# Patient Record
Sex: Male | Born: 1995
Health system: Southern US, Community
[De-identification: ages and names within clinical notes are randomized; demographics above are authoritative.]

## PROBLEM LIST (undated history)

## (undated) DIAGNOSIS — M93001 Unspecified slipped upper femoral epiphysis (nontraumatic), right hip: Secondary | ICD-10-CM

## (undated) DIAGNOSIS — M93002 Unspecified slipped upper femoral epiphysis (nontraumatic), left hip: Secondary | ICD-10-CM

## (undated) HISTORY — DX: Unspecified slipped upper femoral epiphysis (nontraumatic), left hip: M93.002

## (undated) HISTORY — DX: Unspecified slipped upper femoral epiphysis (nontraumatic), right hip: M93.001

---
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
## 2021-12-18 ENCOUNTER — Ambulatory Visit: Payer: 59 | Admitting: Physician Assistant

## 2021-12-29 ENCOUNTER — Encounter: Payer: Self-pay | Admitting: Family Medicine

## 2021-12-29 ENCOUNTER — Ambulatory Visit: Payer: 59 | Admitting: Family Medicine

## 2021-12-29 VITALS — BP 122/78 | HR 60 | Temp 98.5°F | Ht 74.5 in | Wt 200.0 lb

## 2021-12-29 DIAGNOSIS — T148XXA Other injury of unspecified body region, initial encounter: Secondary | ICD-10-CM | POA: Insufficient documentation

## 2021-12-29 DIAGNOSIS — R0981 Nasal congestion: Secondary | ICD-10-CM | POA: Diagnosis not present

## 2021-12-29 DIAGNOSIS — F411 Generalized anxiety disorder: Secondary | ICD-10-CM

## 2021-12-29 DIAGNOSIS — F1010 Alcohol abuse, uncomplicated: Secondary | ICD-10-CM | POA: Diagnosis not present

## 2021-12-29 DIAGNOSIS — F1210 Cannabis abuse, uncomplicated: Secondary | ICD-10-CM

## 2021-12-29 HISTORY — DX: Nasal congestion: R09.81

## 2021-12-29 HISTORY — DX: Other injury of unspecified body region, initial encounter: T14.8XXA

## 2021-12-29 NOTE — Assessment & Plan Note (Signed)
Polysubstance use including THC and alcohol Recommend reduction and cessation

## 2021-12-29 NOTE — Assessment & Plan Note (Signed)
Controlled with OTC Flonase

## 2021-12-29 NOTE — Assessment & Plan Note (Signed)
Area of concern.  Normal Given association with kickboxing injury, may have been hematoma Provided reassurance We will continue to monitor

## 2021-12-29 NOTE — Assessment & Plan Note (Signed)
Counseled on the effects of excessive alcohol use Recommend reduction to no more than 14 drinks per week Continue monitor

## 2021-12-29 NOTE — Assessment & Plan Note (Signed)
Associate with comorbid substance use Recommend reduction as discussed Follow-up in 1 month

## 2021-12-29 NOTE — Progress Notes (Signed)
   Philip Park is a 26 y.o. male who presents today for an office visit.  Assessment/Plan:   Problem List Items Addressed This Visit       Respiratory   Sinus congestion - Primary    Controlled with OTC Flonase        Other   Hematoma    Area of concern.  Normal Given association with kickboxing injury, may have been hematoma Provided reassurance We will continue to monitor      Alcohol abuse    Counseled on the effects of excessive alcohol use Recommend reduction to no more than 14 drinks per week Continue monitor      Generalized anxiety disorder    Associate with comorbid substance use Recommend reduction as discussed Follow-up in 1 month      Tetrahydrocannabinol (THC) use disorder, mild, abuse    Polysubstance use including THC and alcohol Recommend reduction and cessation          Subjective:  HPI:  Philip Park is a 26 y.o. male who has Sinus congestion; Hematoma; Alcohol abuse; Generalized anxiety disorder; and Tetrahydrocannabinol (THC) use disorder, mild, abuse on their problem list..   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   Patient here to establish care.  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  He had surgery on both knees in 2014.  Reports occasional pain for which he takes Tylenol.  Patient reports a bump that appeared on the right upper quadrant of his abdomen.  It was not painful.  It has resolved over the past 2 weeks on its own.  Patient does not have any chest pain or shortness of breath.  Patient is an avid kickboxing.  He thinks it may have occurred while practicing.  Patient reports nasal congestion.  This is intermittent.  It is controlled with OTC Flonase.  Patient  does use marijuana regularly.  And drink approximately 20 shots of liquor a week.  Patient does have elevated GAD-7 today, negative PHQ-9, no SI or HI.      Objective:  Physical Exam: BP 122/78 (BP Location: Left Arm, Patient Position: Sitting, Cuff Size: Large)   Pulse 60   Temp 98.5 F (36.9 C) (Temporal)   Ht 6' 2.5" (1.892 m)   Wt 200 lb (90.7 kg)   SpO2 98%   BMI 25.33 kg/m    General: No acute distress. Awake and conversant.  Eyes: Normal conjunctiva, anicteric. Round symmetric pupils.  ENT: Hearing grossly intact. No nasal discharge.  Neck: Neck is supple. No masses or thyromegaly.  Respiratory: Respirations are non-labored. No auditory wheezing.  ABD: Nontender, nondistended, there is no noticeable lump or nodule or overlying bruising in the area or anywhere else, this was reexamined while patient was bearing down and still no evidence of nodule Skin: Warm. No rashes or ulcers.  Psych: Alert and oriented. Cooperative, Appropriate mood and affect, Normal judgment.  CV: No cyanosis or JVD MSK: Normal ambulation. No clubbing  Neuro: Sensation and CN II-XII grossly normal.        Garner Nash, MD, MS

## 2022-01-26 ENCOUNTER — Ambulatory Visit: Payer: 59 | Admitting: Family Medicine

## 2022-01-26 ENCOUNTER — Encounter: Payer: Self-pay | Admitting: Family Medicine

## 2022-01-26 ENCOUNTER — Other Ambulatory Visit: Payer: Self-pay | Admitting: Family Medicine

## 2022-01-26 VITALS — BP 118/76 | HR 82 | Temp 97.5°F | Wt 209.4 lb

## 2022-01-26 DIAGNOSIS — Z1159 Encounter for screening for other viral diseases: Secondary | ICD-10-CM | POA: Diagnosis not present

## 2022-01-26 DIAGNOSIS — J310 Chronic rhinitis: Secondary | ICD-10-CM

## 2022-01-26 DIAGNOSIS — T485X5A Adverse effect of other anti-common-cold drugs, initial encounter: Secondary | ICD-10-CM

## 2022-01-26 DIAGNOSIS — Z0000 Encounter for general adult medical examination without abnormal findings: Secondary | ICD-10-CM

## 2022-01-26 DIAGNOSIS — T148XXA Other injury of unspecified body region, initial encounter: Secondary | ICD-10-CM

## 2022-01-26 DIAGNOSIS — F1210 Cannabis abuse, uncomplicated: Secondary | ICD-10-CM

## 2022-01-26 DIAGNOSIS — L818 Other specified disorders of pigmentation: Secondary | ICD-10-CM

## 2022-01-26 DIAGNOSIS — R0981 Nasal congestion: Secondary | ICD-10-CM

## 2022-01-26 DIAGNOSIS — F1010 Alcohol abuse, uncomplicated: Secondary | ICD-10-CM

## 2022-01-26 DIAGNOSIS — Z23 Encounter for immunization: Secondary | ICD-10-CM

## 2022-01-26 DIAGNOSIS — Z136 Encounter for screening for cardiovascular disorders: Secondary | ICD-10-CM | POA: Diagnosis not present

## 2022-01-26 DIAGNOSIS — Z1329 Encounter for screening for other suspected endocrine disorder: Secondary | ICD-10-CM

## 2022-01-26 DIAGNOSIS — F411 Generalized anxiety disorder: Secondary | ICD-10-CM

## 2022-01-26 DIAGNOSIS — Z114 Encounter for screening for human immunodeficiency virus [HIV]: Secondary | ICD-10-CM

## 2022-01-26 LAB — LIPID PANEL
Cholesterol: 132 mg/dL (ref 0–200)
HDL: 44.1 mg/dL (ref >39.00)
LDL Cholesterol: 78 mg/dL (ref 0–99)
NonHDL: 87.61
Total CHOL/HDL Ratio: 3
Triglycerides: 46 mg/dL (ref 0.0–149.0)
VLDL: 9.2 mg/dL (ref 0.0–40.0)

## 2022-01-26 LAB — COMPREHENSIVE METABOLIC PANEL
ALT: 25 U/L (ref 0–53)
AST: 21 U/L (ref 0–37)
Albumin: 4.4 g/dL (ref 3.5–5.2)
Alkaline Phosphatase: 50 U/L (ref 39–117)
BUN: 13 mg/dL (ref 6–23)
CO2: 27 mEq/L (ref 19–32)
Calcium: 9.4 mg/dL (ref 8.4–10.5)
Chloride: 102 mEq/L (ref 96–112)
Creatinine, Ser: 0.88 mg/dL (ref 0.40–1.50)
GFR: 118.34 mL/min (ref >60.00)
Glucose, Bld: 89 mg/dL (ref 70–99)
Potassium: 4.1 mEq/L (ref 3.5–5.1)
Sodium: 137 mEq/L (ref 135–145)
Total Bilirubin: 0.6 mg/dL (ref 0.2–1.2)
Total Protein: 7.4 g/dL (ref 6.0–8.3)

## 2022-01-26 MED ORDER — FLUTICASONE PROPIONATE 50 MCG/ACT NA SUSP: 1.0000 | Freq: Every day | NASAL | 11 refills | Status: AC

## 2022-01-26 MED ORDER — FLUTICASONE PROPIONATE 50 MCG/ACT NA SUSP: 1.0000 | Freq: Every day | NASAL | 11 refills | Status: DC

## 2022-01-26 NOTE — Assessment & Plan Note (Signed)
Recommend cessation of Afrin Restart Flonase, if no improvement follow-up

## 2022-01-26 NOTE — Assessment & Plan Note (Signed)
Resolved

## 2022-01-26 NOTE — Assessment & Plan Note (Signed)
Patient says he is working on reduction Encourage reduction in the healthy levels

## 2022-01-26 NOTE — Assessment & Plan Note (Signed)
Improved GAD-7 Reports no complaints of anxiety

## 2022-01-26 NOTE — Assessment & Plan Note (Signed)
Recommend continue reduction and cessation
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
## 2022-01-27 ENCOUNTER — Other Ambulatory Visit: Payer: Self-pay

## 2022-01-27 DIAGNOSIS — Z23 Encounter for immunization: Secondary | ICD-10-CM

## 2022-01-27 LAB — HEPATITIS B SURFACE ANTIGEN: Hepatitis B Surface Ag: NONREACTIVE

## 2022-01-27 LAB — HEPATITIS B CORE ANTIBODY, TOTAL: Hep B Core Total Ab: NONREACTIVE

## 2022-01-27 LAB — HIV ANTIBODY (ROUTINE TESTING W REFLEX): HIV 1&2 Ab, 4th Generation: NONREACTIVE

## 2022-01-27 LAB — HEPATITIS B SURFACE ANTIBODY,QUALITATIVE: Hep B S Ab: NONREACTIVE

## 2022-01-27 LAB — HEPATITIS C ANTIBODY: Hepatitis C Ab: NONREACTIVE

## 2022-02-10 ENCOUNTER — Ambulatory Visit (INDEPENDENT_AMBULATORY_CARE_PROVIDER_SITE_OTHER): Payer: 59

## 2022-02-10 DIAGNOSIS — Z23 Encounter for immunization: Secondary | ICD-10-CM

## 2022-02-10 NOTE — Progress Notes (Signed)
Per orders of Dr. Janee Morn Pt is here for Hep B vaccine. Pt received injection IM in the RT deltoid, given by Dominic Pea, CMA. Pt tolerated injection well. Pt was notified to report any adverse reactions to me immediately. Sw, cma

## 2022-04-14 ENCOUNTER — Ambulatory Visit: Payer: 59 | Admitting: Family Medicine

## 2022-04-14 ENCOUNTER — Encounter: Payer: Self-pay | Admitting: Family Medicine

## 2022-04-14 VITALS — BP 134/82 | HR 85 | Temp 97.6°F | Wt 201.2 lb

## 2022-04-14 DIAGNOSIS — H6123 Impacted cerumen, bilateral: Secondary | ICD-10-CM | POA: Diagnosis not present

## 2022-04-14 DIAGNOSIS — H9191 Unspecified hearing loss, right ear: Secondary | ICD-10-CM

## 2022-04-14 MED ORDER — ACETIC ACID 2 % OT SOLN: 4.0000 [drp] | Freq: Three times a day (TID) | OTIC | 0 refills | Status: AC

## 2022-04-14 NOTE — Progress Notes (Signed)
Assessment/Plan:   Problem List Items Addressed This Visit   None Visit Diagnoses     Bilateral impacted cerumen    -  Primary   Relevant Medications   acetic acid 2 % otic solution   Decreased hearing of right ear              Subjective:  HPI:  Philip Park is a 26 y.o. male who has Sinus congestion; Hematoma; Alcohol abuse; Generalized anxiety disorder; Tetrahydrocannabinol (THC) use disorder, mild, abuse; and Rhinitis medicamentosa on their problem list..   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 ) .   Ear Pain: Patient presents with right ear pain.  Symptoms include plugged sensation in the right ear and decreased hearing . Symptoms began a few days ago and are gradually improving since that time. Patient denies chills, dyspnea, eye irritation, fever, nasal congestion, nonproductive cough, productive cough, sneezing, sore throat, and sweats. Ear history: 0 previous ear infections.  Past Surgical History:  Procedure Laterality Date   fcfe  2013    Outpatient Medications Prior to Visit  Medication Sig Dispense Refill   fluticasone (FLONASE) 50 MCG/ACT nasal spray Place 1 spray into both nostrils daily. 1 g 11   No facility-administered medications prior to visit.    Family History  Problem Relation Age of Onset   Hypertension Mother    Hypertension Maternal Grandmother    Diabetes Maternal Grandmother     Social History   Socioeconomic History   Marital status: Single    Spouse name: Not on file   Number of children: Not on file   Years of education: Not on file   Highest education level: Not on file  Occupational History   Occupation: Merchant navy officer  Tobacco Use   Smoking status: Former    Packs/day: 0.50    Years: 1.00    Total pack years: 0.50    Types: Cigarettes   Smokeless  tobacco: Never  Vaping Use   Vaping Use: Every day  Substance and Sexual Activity   Alcohol use: Yes    Alcohol/week: 20.0 standard drinks of alcohol    Types: 20 Shots of liquor per week   Drug use: Yes    Types: Marijuana   Sexual activity: Yes  Other Topics Concern   Not on file  Social History Narrative   Not on file   Social Determinants of Health   Financial Resource Strain: Not on file  Food Insecurity: Not on file  Transportation Needs: Not on file  Physical Activity: Not on file  Stress: Not on file  Social Connections: Not on file  Intimate Partner Violence: Not on file                                                                                                 Objective:  Physical Exam: BP 134/82 (BP Location: Left Arm, Patient Position: Sitting, Cuff Size: Large)   Pulse 85  Temp 97.6 F (36.4 C) (Temporal)   Wt 201 lb 3.2 oz (91.3 kg)   SpO2 98%   BMI 25.49 kg/m    General: No acute distress. Awake and conversant.  Eyes: Normal conjunctiva, anicteric. Round symmetric pupils.  ENT: Hearing grossly intact. No nasal discharge.  Bilateral complete cerumen impaction,  Neck: Neck is supple. No masses or thyromegaly.  Respiratory: Respirations are non-labored. No auditory wheezing.  Skin: Warm. No rashes or ulcers.  Psych: Alert and oriented. Cooperative, Appropriate mood and affect, Normal judgment.  CV: No cyanosis or JVD MSK: Normal ambulation. No clubbing  Neuro: Sensation and CN II-XII grossly normal.    Cerumen was removed using gentle irrigation. Tympanic membranes are intact following the procedure.  Auditory canals are normal. Hearing improved     Garner Nash, MD, MS

## 2022-07-09 ENCOUNTER — Encounter: Payer: Self-pay | Admitting: Family Medicine

## 2022-07-09 ENCOUNTER — Ambulatory Visit (INDEPENDENT_AMBULATORY_CARE_PROVIDER_SITE_OTHER): Payer: 59 | Admitting: Family Medicine

## 2022-07-09 VITALS — BP 124/78 | HR 75 | Temp 97.7°F | Wt 202.6 lb

## 2022-07-09 DIAGNOSIS — R350 Frequency of micturition: Secondary | ICD-10-CM | POA: Diagnosis not present

## 2022-07-09 DIAGNOSIS — R319 Hematuria, unspecified: Secondary | ICD-10-CM | POA: Diagnosis not present

## 2022-07-09 DIAGNOSIS — Z113 Encounter for screening for infections with a predominantly sexual mode of transmission: Secondary | ICD-10-CM | POA: Diagnosis not present

## 2022-07-09 LAB — POCT URINALYSIS DIPSTICK
Bilirubin, UA: NEGATIVE
Blood, UA: POSITIVE
Glucose, UA: NEGATIVE
Ketones, UA: NEGATIVE
Leukocytes, UA: NEGATIVE
Nitrite, UA: NEGATIVE
Protein, UA: NEGATIVE
Spec Grav, UA: 1.01 (ref 1.010–1.025)
Urobilinogen, UA: 0.2 E.U./dL — AB
pH, UA: 5.5 (ref 5.0–8.0)

## 2022-07-09 NOTE — Patient Instructions (Signed)
For urinary symptoms, we are testing for STD's and infection as discuss. Continue hydrating, follow up if no improvement.

## 2022-07-09 NOTE — Progress Notes (Signed)
Assessment/Plan:   Problem List Items Addressed This Visit   None Visit Diagnoses     Frequent urination    -  Primary   Relevant Orders   POCT Urinalysis Dipstick (Completed)   C. trachomatis/N. gonorrhoeae RNA (Completed)   HIV Antibody (routine testing w rflx) (Completed)   RPR (Completed)   HCV Ab w Reflex to Quant PCR (Completed)   Hematuria, unspecified type       Relevant Orders   C. trachomatis/N. gonorrhoeae RNA (Completed)   HIV Antibody (routine testing w rflx) (Completed)   RPR (Completed)   HCV Ab w Reflex to Quant PCR (Completed)   Screen for STD (sexually transmitted disease)       Relevant Orders   HIV Antibody (routine testing w rflx) (Completed)   RPR (Completed)   HCV Ab w Reflex to Quant PCR (Completed)      The patient's chief complaint is of a recent onset of urinary discomfort accompanied by hematuria noted on urinalysis. There's no evidence of a typical urinary tract infection, but given the blood in the urine, an STD or other urological condition cannot be ruled out.  Differential diagnosis:  Urinary tract infection (less common in males, especially young ones without typical symptoms) Sexually transmitted disease (gonorrhea or chlamydia, considering the demographic even in the absence of discharge) Urolithiasis (kidney stones) Traumatic injury (from kickboxing or other activities)  Plan: Based on the patient's symptoms and the urinalysis findings, we will proceed with the STD testing, including gonorrhea and chlamydia by urine, as well as a urine culture for bacteria. If the patient is agreeable, blood tests for syphilis, HIV, and hepatitis C should be included in the screening to rule out these conditions. Further investigation and referral to a urologist may be warranted if symptoms persist or worsen or if the patient reports new or concerning symptoms in the future.  There are no discontinued medications.    Subjective:  CHIEF COMPLAINT: The  patient presents with urinary discomfort that began yesterday.  HISTORY OF PRESENT ILLNESS:  Problem 1: The patient reports experiencing urinary discomfort that started yesterday. He denies a burning sensation with urination but describes difficulty urinating and frequency. The symptoms seem to be improving today without any specific interventions. The patient has no history of similar symptoms. Urinalysis revealed some blood but was negative for typical signs of infection like nitrites and white blood cells. Given the patient's gender and age, the possibility of an STD was discussed as a potential cause, despite the absence of penile discharge.  Problem 2: The patient has a new tattoo with a recent application of a second skin dressing. He reports the need for the tattoo to be retouched but does not report any complications related to the tattoo.  REVIEW OF SYSTEMS: The patient denies fevers, chills, nausea, vomiting, and any penile discharge. All other systems were reviewed and reported negative.  Past Surgical History:  Procedure Laterality Date   fcfe  2013    Outpatient Medications Prior to Visit  Medication Sig Dispense Refill   fluticasone (FLONASE) 50 MCG/ACT nasal spray Place 1 spray into both nostrils daily. 1 g 11   No facility-administered medications prior to visit.    Family History  Problem Relation Age of Onset   Hypertension Mother    Hypertension Maternal Grandmother    Diabetes Maternal Grandmother     Social History   Socioeconomic History   Marital status: Single    Spouse name: Not on file  Number of children: Not on file   Years of education: Not on file   Highest education level: Not on file  Occupational History   Occupation: Technician  Tobacco Use   Smoking status: Former    Packs/day: 0.50    Years: 1.00    Total pack years: 0.50    Types: Cigarettes   Smokeless tobacco: Never  Vaping Use   Vaping Use: Every day  Substance and Sexual  Activity   Alcohol use: Yes    Alcohol/week: 20.0 standard drinks of alcohol    Types: 20 Shots of liquor per week   Drug use: Yes    Types: Marijuana   Sexual activity: Yes  Other Topics Concern   Not on file  Social History Narrative   Not on file   Social Determinants of Health   Financial Resource Strain: Not on file  Food Insecurity: Not on file  Transportation Needs: Not on file  Physical Activity: Not on file  Stress: Not on file  Social Connections: Not on file  Intimate Partner Violence: Not on file                                                                                                 Objective:  Physical Exam: BP 124/78 (BP Location: Left Arm, Patient Position: Sitting, Cuff Size: Large)   Pulse 75   Temp 97.7 F (36.5 C) (Temporal)   Wt 202 lb 9.6 oz (91.9 kg)   SpO2 98%   BMI 25.66 kg/m    PHYSICAL EXAMINATION: General: No acute distress. Awake and conversant. Eyes: Normal conjunctiva, anicteric. Round symmetric pupils. ENT: Hearing grossly intact. No nasal discharge. Neck: Neck is supple. No masses or thyromegaly. Respiratory: Respirations are non-labored. No auditory wheezing. Skin: Warm. No rashes or ulcers. Tattoo on arm present and appears well-healed without signs of recent infection. Psych: Alert and oriented. Cooperative, with an appropriate mood and affect. Normal judgment. CV: No cyanosis or JVD MSK: Normal ambulation. No clubbing Neuro: CN II-XII grossly normal, no tremor  LABS: Initial urinalysis showed some blood. No nitrites or white blood cells were present. Further STD testing by urine and blood, as well as a urine culture, were discussed and are pending.      Alesia Banda, MD, MS

## 2022-07-10 LAB — C. TRACHOMATIS/N. GONORRHOEAE RNA
C. trachomatis RNA, TMA: NOT DETECTED
N. gonorrhoeae RNA, TMA: NOT DETECTED

## 2022-07-10 LAB — HCV AB W REFLEX TO QUANT PCR: HCV Ab: NONREACTIVE

## 2022-07-10 LAB — HCV INTERPRETATION

## 2022-07-10 LAB — SYPHILIS: RPR W/REFLEX TO RPR TITER AND TREPONEMAL ANTIBODIES, TRADITIONAL SCREENING AND DIAGNOSIS ALGORITHM: RPR Ser Ql: NONREACTIVE

## 2022-07-10 LAB — HIV ANTIBODY (ROUTINE TESTING W REFLEX): HIV 1&2 Ab, 4th Generation: NONREACTIVE

## 2023-01-29 ENCOUNTER — Encounter: Payer: 59 | Admitting: Family Medicine

## 2023-02-04 ENCOUNTER — Encounter: Payer: Self-pay | Admitting: Family Medicine

## 2023-02-04 ENCOUNTER — Ambulatory Visit (INDEPENDENT_AMBULATORY_CARE_PROVIDER_SITE_OTHER): Payer: 59 | Admitting: Family Medicine

## 2023-02-04 VITALS — BP 118/60 | HR 75 | Temp 98.7°F | Ht 74.5 in | Wt 205.4 lb

## 2023-02-04 DIAGNOSIS — Z130 Encounter for screening for diseases of the blood and blood-forming organs and certain disorders involving the immune mechanism: Secondary | ICD-10-CM | POA: Diagnosis not present

## 2023-02-04 DIAGNOSIS — Z136 Encounter for screening for cardiovascular disorders: Secondary | ICD-10-CM | POA: Diagnosis not present

## 2023-02-04 DIAGNOSIS — Z0000 Encounter for general adult medical examination without abnormal findings: Secondary | ICD-10-CM | POA: Diagnosis not present

## 2023-02-04 DIAGNOSIS — Z1321 Encounter for screening for nutritional disorder: Secondary | ICD-10-CM | POA: Diagnosis not present

## 2023-02-04 DIAGNOSIS — G4700 Insomnia, unspecified: Secondary | ICD-10-CM | POA: Diagnosis not present

## 2023-02-04 DIAGNOSIS — M549 Dorsalgia, unspecified: Secondary | ICD-10-CM

## 2023-02-04 DIAGNOSIS — Z1329 Encounter for screening for other suspected endocrine disorder: Secondary | ICD-10-CM

## 2023-02-04 DIAGNOSIS — G8929 Other chronic pain: Secondary | ICD-10-CM | POA: Diagnosis not present

## 2023-02-04 DIAGNOSIS — Z13228 Encounter for screening for other metabolic disorders: Secondary | ICD-10-CM

## 2023-02-04 LAB — BASIC METABOLIC PANEL
BUN: 14 mg/dL (ref 6–23)
CO2: 29 mEq/L (ref 19–32)
Calcium: 9.2 mg/dL (ref 8.4–10.5)
Chloride: 104 mEq/L (ref 96–112)
Creatinine, Ser: 0.91 mg/dL (ref 0.40–1.50)
GFR: 115.16 mL/min (ref >60.00)
Glucose, Bld: 90 mg/dL (ref 70–99)
Potassium: 4.1 mEq/L (ref 3.5–5.1)
Sodium: 139 mEq/L (ref 135–145)

## 2023-02-04 LAB — LIPID PANEL
Cholesterol: 164 mg/dL (ref 0–200)
HDL: 45.8 mg/dL (ref >39.00)
LDL Cholesterol: 107 mg/dL — ABNORMAL HIGH (ref 0–99)
NonHDL: 118.68
Total CHOL/HDL Ratio: 4
Triglycerides: 59 mg/dL (ref 0.0–149.0)
VLDL: 11.8 mg/dL (ref 0.0–40.0)

## 2023-02-04 MED ORDER — TRAZODONE HCL 50 MG PO TABS: 25.0000 mg | ORAL_TABLET | Freq: Every evening | ORAL | 3 refills | Status: DC | PRN

## 2023-02-04 NOTE — Assessment & Plan Note (Signed)
Continue seeing the chiropractor. Suggest trying over-the-counter topical Voltaren gel for symptomatic relief.

## 2023-02-04 NOTE — Progress Notes (Signed)
Assessment  Assessment/Plan:   Problem List Items Addressed This Visit       Other   Insomnia    Trial of Trazodone 25-50 mg PO at bedtime as needed. Patient Counseling: Discussed sleep hygiene practices, avoiding stimuli like phone screens before bed, regular sleep schedules, and avoiding heavy exercise late in the evening. Follow-up: If symptoms persist or worsen, consider referral for cognitive-behavioral therapy for insomnia (CBT-I).      Relevant Medications   traZODone (DESYREL) 50 MG tablet   Chronic back pain    Continue seeing the chiropractor. Suggest trying over-the-counter topical Voltaren gel for symptomatic relief.      Relevant Medications   traZODone (DESYREL) 50 MG tablet   Other Visit Diagnoses     Encounter for well adult exam without abnormal findings    -  Primary   Screening, heart disease, ischemic       Relevant Orders   Lipid Profile   Screening for endocrine, nutritional, metabolic and immunity disorder       Relevant Orders   Basic Metabolic Panel (BMET)       There are no discontinued medications.  Patient Counseling(The following topics were reviewed and/or handout was given):  -Nutrition: Stressed importance of moderation in sodium/caffeine intake, saturated fat and cholesterol, caloric balance, sufficient intake of fresh fruits, vegetables, and fiber.  -Stressed the importance of regular exercise.   -Substance Abuse: Discussed cessation/primary prevention of tobacco, alcohol, or other drug use; driving or other dangerous activities under the influence; availability of treatment for abuse.   -Injury prevention: Discussed safety belts, safety helmets, smoke detector, smoking near bedding or upholstery.   -Sexuality: Discussed sexually transmitted diseases, partner selection, use of condoms, avoidance of unintended pregnancy and contraceptive alternatives.   -Dental health: Discussed importance of regular tooth brushing, flossing, and dental  visits.  -Health maintenance and immunizations reviewed. Please refer to Health maintenance section.  Return to care in 1 year for next preventative visit.       Subjective:  Chief complaint Encounter date: 02/04/2023  Chief Complaint  Patient presents with   Annual Exam    With fasting lab work, concerns with insomnia   Philip Park is a 27 y.o. male who presents today for his annual comprehensive physical exam.    History of Present Illness: The patient, Philip Park, a healthy 27 year old male, presented for his annual physical exam. 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 Last year's labs indicated excellent metabolic parameters, and he reports continuing similar health habits. However, he mentioned having trouble falling asleep and staying asleep over the past few months. He denies caffeine use but consumes alcohol once a week (3-4 drinks). He recently decreased his intake of alcohol, marijuana, and other substances compared to last year. He also started seeing a chiropractor for back and neck pain, which he attributes to previous hip surgery and heavy training.  Lifestyle:  Diet:Emphasis on eating more fruits and vegetables. Exercise: Kickboxing regularly, as discussed.  ROS     02/04/2023    8:28 AM 01/26/2022    1:37 PM 12/29/2021    1:42 PM  GAD-7 Generalized Anxiety Disorder Screening Tool  1. Feeling Nervous, Anxious, or on Edge 1 2 2   2. Not Being Able to Stop or Control Worrying 1 0 1  3. Worrying Too Much About Different Things 1 1 1   4. Trouble Relaxing 1 2 2   5. Being So Restless it's Hard To Sit Still 0  0 2  6. Becoming Easily Annoyed or Irritable 1 2 3   7. Feeling Afraid As If Something Awful Might Happen 0 0 0  Total GAD-7 Score 5 7 11   Difficulty At Work, Home, or Getting  Along With Others? Not difficult at all Somewhat difficult Somewhat difficult      02/04/2023    8:28 AM 01/26/2022    1:37 PM 12/29/2021    1:42 PM  Depression  screen PHQ 2/9  Decreased Interest 1 1 1   Down, Depressed, Hopeless 1 0 0  PHQ - 2 Score 2 1 1   Altered sleeping 2 1 1   Tired, decreased energy 1 1 1   Change in appetite 1 0 1  Feeling bad or failure about yourself  0 0 0  Trouble concentrating 1 1 0  Moving slowly or fidgety/restless 0 0 1  Suicidal thoughts 0 0 0  PHQ-9 Score 7 4 5   Difficult doing work/chores Somewhat difficult Not difficult at all Somewhat difficult    Health Maintenance Due  Topic Date Due   HPV VACCINES (2 - Male 3-dose series) 02/23/2022   INFLUENZA VACCINE  01/28/2023     Dental: Up-to-date Vision: Up-to-date  PMH:  The following were reviewed and entered/updated in epic: Past Medical History:  Diagnosis Date   SCFE (slipped capital femoral epiphysis), left    SCFE (slipped capital femoral epiphysis), right     Patient Active Problem List   Diagnosis Date Noted   Insomnia 02/04/2023   Chronic back pain 02/04/2023   Rhinitis medicamentosa 01/26/2022   Sinus congestion 12/29/2021   Hematoma 12/29/2021   Alcohol abuse 12/29/2021   Generalized anxiety disorder 12/29/2021   Tetrahydrocannabinol (THC) use disorder, mild, abuse 12/29/2021    Past Surgical History:  Procedure Laterality Date   fcfe  2013    Family History  Problem Relation Age of Onset   Hypertension Mother    Hypertension Maternal Grandmother    Diabetes Maternal Grandmother     Medications- reviewed and updated Outpatient Medications Prior to Visit  Medication Sig Dispense Refill   fluticasone (FLONASE) 50 MCG/ACT nasal spray Place 1 spray into both nostrils daily. 1 g 11   No facility-administered medications prior to visit.    Allergies  Allergen Reactions   Penicillin G Rash   Penicillins Rash    Social History   Socioeconomic History   Marital status: Single    Spouse name: Not on file   Number of children: Not on file   Years of education: Not on file   Highest education level: Not on file   Occupational History   Occupation: Technician  Tobacco Use   Smoking status: Former    Current packs/day: 0.50    Average packs/day: 0.5 packs/day for 1 year (0.5 ttl pk-yrs)    Types: Cigarettes   Smokeless tobacco: Never  Vaping Use   Vaping status: Every Day  Substance and Sexual Activity   Alcohol use: Yes    Alcohol/week: 20.0 standard drinks of alcohol    Types: 20 Shots of liquor per week   Drug use: Yes    Types: Marijuana   Sexual activity: Yes  Other Topics Concern   Not on file  Social History Narrative   Not on file   Social Determinants of Health   Financial Resource Strain: Not on file  Food Insecurity: Not on file  Transportation Needs: Not on file  Physical Activity: Not on file  Stress: Not on file  Social Connections: Not  on file        Objective:  Physical Exam: BP 118/60 (BP Location: Left Arm)   Pulse 75   Temp 98.7 F (37.1 C) (Oral)   Ht 6' 2.5" (1.892 m)   Wt 205 lb 6.4 oz (93.2 kg)   SpO2 99%   BMI 26.02 kg/m   Body mass index is 26.02 kg/m. Wt Readings from Last 3 Encounters:  02/04/23 205 lb 6.4 oz (93.2 kg)  07/09/22 202 lb 9.6 oz (91.9 kg)  04/14/22 201 lb 3.2 oz (91.3 kg)    Physical Exam      At today's visit, we discussed treatment options, associated risk and benefits, and engage in counseling as needed.  Additionally the following were reviewed: Past medical records, past medical and surgical history, family and social background, as well as relevant laboratory results, imaging findings, and specialty notes, where applicable.  This message was generated using dictation software, and as a result, it may contain unintentional typos or errors.  Nevertheless, extensive effort was made to accurately convey at the pertinent aspects of the patient visit.    There may have been are other unrelated non-urgent complaints, but due to the busy schedule and the amount of time already spent with him, time does not permit to address  these issues at today's visit. Another appointment may have or has been requested to review these additional issues.   Thomes Dinning, MD, MS

## 2023-02-04 NOTE — Patient Instructions (Signed)
Follow sleep hygiene guidelines: Avoid using devices before bedtime, maintain a consistent sleep schedule, and avoid exercising right before bed. Use Trazodone as needed: Take 25-50 mg at night if having trouble falling or staying asleep. Continue current health practices: Maintain regular exercise, a healthy diet, and avoid excessive alcohol and smoking. Use over-the-counter Voltaren gel: Apply to areas with joint pain or aches as needed. Wear seat belts and sunscreen: Use seat belts while driving and apply sunscreen regularly to prevent skin damage.

## 2023-02-04 NOTE — Assessment & Plan Note (Signed)
Trial of Trazodone 25-50 mg PO at bedtime as needed. Patient Counseling: Discussed sleep hygiene practices, avoiding stimuli like phone screens before bed, regular sleep schedules, and avoiding heavy exercise late in the evening. Follow-up: If symptoms persist or worsen, consider referral for cognitive-behavioral therapy for insomnia (CBT-I).

## 2023-11-17 ENCOUNTER — Ambulatory Visit: Payer: Self-pay

## 2023-11-17 NOTE — Telephone Encounter (Signed)
  Chief Complaint: tick bite Symptoms: redness Frequency: two days ago Pertinent Negatives: Patient denies target appearing rash, fever Disposition: [] ED /[] Urgent Care (no appt availability in office) / [] Appointment(In office/virtual)/ []  [facility]/ [x] Home Care/ [] Refused Recommended Disposition /[] [facility]/ []  Follow-up with PCP Additional Notes: patient with tick bite occurring two days ago. Denies bullseye rash, generalized rash, pain, fever. Reviewed signs and symptoms to call back, verbalized understanding, will use home care methods Copied from CRM [id number]. Topic: Clinical - Red Word Triage >> Nov 17, 2023 11:27 AM Adonis Hoot wrote: Red Word that prompted transfer to Nurse Triage: tick bite on abdomen 2 days ago ,red and itchy Reason for Disposition  Unknown type of tick bite with no complications  Answer Assessment - Initial Assessment Questions 1. ATTACHED:  "Is the tick still on the skin?"  (e.g., yes, no, unsure)     no  3. ONSET - TICK NOT STILL ATTACHED: "If the tick has been removed, how long do you think the tick was attached before you removed it?" (e.g., 5 hours, 2 days). "When was this?"     2 days ago 4. LOCATION: "Where is the tick bite located?" (e.g., arm, leg)     abdomen 5. TYPE of TICK: "Is it a wood tick or a deer tick?" (e.g., deer tick, wood tick; unsure)     unsure 6. SIZE of TICK: "How big is the tick?" (e.g., size of poppy seed, apple seed, watermelon seed; unsure) Note: Deer ticks can be the size of a poppy seed (nymph) or an apple seed (adult).       Smaller than an apple seed 7. ENGORGED: "Did the tick look flat or engorged (full, swollen)?" (e.g., flat, engorged; unsure)     no 8. OTHER SYMPTOMS: "Do you have any other symptoms?" (e.g., fever, rash, redness at bite area, red ring around bite)     Redness at bite area, dime sized area of redness  Protocols used: Tick Bite-A-AH

## 2023-11-17 NOTE — Telephone Encounter (Signed)
 Called pt and schedule a OV for tomorrow at 1:20 pm to address concern. Patient verbalized understanding.

## 2023-11-18 ENCOUNTER — Encounter: Payer: Self-pay | Admitting: Family Medicine

## 2023-11-18 ENCOUNTER — Ambulatory Visit: Admitting: Family Medicine

## 2023-11-18 VITALS — BP 113/60 | HR 72 | Temp 98.0°F | Resp 18 | Wt 209.0 lb

## 2023-11-18 DIAGNOSIS — W57XXXA Bitten or stung by nonvenomous insect and other nonvenomous arthropods, initial encounter: Secondary | ICD-10-CM

## 2023-11-18 DIAGNOSIS — S30861A Insect bite (nonvenomous) of abdominal wall, initial encounter: Secondary | ICD-10-CM

## 2023-11-18 HISTORY — DX: Bitten or stung by nonvenomous insect and other nonvenomous arthropods, initial encounter: W57.XXXA

## 2023-11-18 NOTE — Progress Notes (Signed)
 Assessment & Plan   Assessment/Plan:    Assessment & Plan Tick bite with local reaction Tick bite on the abdomen with mild erythema and pruritus, no purulent drainage, healing well. No systemic symptoms such as fever, chills, headache, or rash. Differential diagnosis includes potential for [facility] spotted fever or Lyme disease, though these are not currently suspected due to lack of systemic symptoms. Discussed potential for Alpha-gal syndrome, which could cause an allergy to red meat, but no symptoms present at this time. Emphasized the importance of monitoring for symptoms such as fever, rash, arthralgia, or malaise, which would necessitate prompt medical attention. Discussed prevention strategies including the use of DEET-containing insect repellent and minimizing skin exposure in tick-prone areas. - Apply topical hydrocortisone or diphenhydramine gel for pruritus. - Take over-the-counter antihistamines such as diphenhydramine or cetirizine for pruritus. - Keep the bite area clean. - Use DEET-containing insect repellent to prevent future tick bites. - Monitor for symptoms such as fever, rash, arthralgia, or malaise, and seek medical attention if these occur. - Monitor for signs of Alpha-gal syndrome, such as oropharyngeal swelling, dyspnea, or emesis after consuming red meat.      There are no discontinued medications.  Return if symptoms worsen or fail to improve.        Subjective:   Encounter date: 11/18/2023  Philip Park is a 28 y.o. male who has Sinus congestion; Hematoma; Alcohol abuse; Generalized anxiety disorder; Tetrahydrocannabinol (THC) use disorder, mild, abuse; Rhinitis medicamentosa; Insomnia; Chronic back pain; and Tick bite of abdominal wall on their problem list..   He  has a past medical history of SCFE (slipped capital femoral epiphysis), left and SCFE (slipped capital femoral epiphysis), right.Philip Park   He presents with chief complaint of Tick  Removal (Pt c/o of tick bite on abdomin for 2 days very itchy no pain present//HM due- HPV vaccine (2nd) ) .   Discussed the use of AI scribe software for clinical note transcription with the patient, who gave verbal consent to proceed.  History of Present Illness Philip Park "Philip Park" is a 28 year old male who presents with a tick bite.  He was bitten by a tick approximately three days ago on his abdomen. The bite is slightly itchy and red but appears to be improving. There is no associated pain.  No fever, chills, headaches, vomiting, or rashes have occurred since the tick bite. He describes the tick as small and brown, without distinctive markings such as a star.  He is concerned about potential allergic reactions to meat following the tick bite, as he primarily consumes red meat.  He is interested in finding out his blood type, as he does not know it.       Past Surgical History:  Procedure Laterality Date   fcfe  2013    Outpatient Medications Prior to Visit  Medication Sig Dispense Refill   traZODone  (DESYREL ) 50 MG tablet Take 0.5-1 tablets (25-50 mg total) by mouth at bedtime as needed for sleep. 30 tablet 3   fluticasone  (FLONASE ) 50 MCG/ACT nasal spray Place 1 spray into both nostrils daily. 1 g 11   No facility-administered medications prior to visit.    Family History  Problem Relation Age of Onset   Hypertension Mother    Hypertension Maternal Grandmother    Diabetes Maternal Grandmother     Social History   Socioeconomic History   Marital status: Single    Spouse name: Not on file   Number of children: Not on  file   Years of education: Not on file   Highest education level: Not on file  Occupational History   Occupation: Technician  Tobacco Use   Smoking status: Former    Current packs/day: 0.50    Average packs/day: 0.5 packs/day for 1 year (0.5 ttl pk-yrs)    Types: Cigarettes   Smokeless tobacco: Never  Vaping Use   Vaping status: Every Day   Substance and Sexual Activity   Alcohol use: Yes    Alcohol/week: 20.0 standard drinks of alcohol    Types: 20 Shots of liquor per week   Drug use: Yes    Types: Marijuana   Sexual activity: Yes  Other Topics Concern   Not on file  Social History Narrative   Not on file   Social Drivers of Health   Financial Resource Strain: Not on file  Food Insecurity: Not on file  Transportation Needs: Not on file  Physical Activity: Not on file  Stress: Not on file  Social Connections: Not on file  Intimate Partner Violence: Not on file                                                                                                  Objective:  Physical Exam: BP 113/60 (BP Location: Left Arm, Patient Position: Sitting, Cuff Size: Large)   Pulse 72   Temp 98 F (36.7 C) (Temporal)   Resp 18   Wt 209 lb (94.8 kg)   SpO2 100%   BMI 26.48 kg/m    Physical Exam GENERAL: Alert, cooperative, well developed, no acute distress. HEENT: Normocephalic, normal oropharynx, moist mucous membranes. CHEST: Clear to auscultation bilaterally, no wheezes, rhonchi, or crackles. CARDIOVASCULAR: Normal heart rate and rhythm, S1 and S2 normal without murmurs. ABDOMEN: Soft, non-tender, non-distended, without organomegaly, normal bowel sounds. Tick bite on left lower abdomen with erythema, nontender, no purulent drainage, healing well. EXTREMITIES: No cyanosis or edema. NEUROLOGICAL: Cranial nerves grossly intact, moves all extremities without gross motor or sensory deficit.     No results found.  No results found for this or any previous visit (from the past 2160 hours).      Carnell Christian, MD, MS

## 2024-02-08 ENCOUNTER — Encounter: Payer: Self-pay | Admitting: Family Medicine

## 2024-02-08 ENCOUNTER — Ambulatory Visit: Admitting: Family Medicine

## 2024-02-08 VITALS — BP 112/55 | HR 60 | Temp 97.9°F | Resp 18 | Wt 209.6 lb

## 2024-02-08 DIAGNOSIS — Z0000 Encounter for general adult medical examination without abnormal findings: Secondary | ICD-10-CM | POA: Diagnosis not present

## 2024-02-08 DIAGNOSIS — E782 Mixed hyperlipidemia: Secondary | ICD-10-CM | POA: Diagnosis not present

## 2024-02-08 DIAGNOSIS — Z23 Encounter for immunization: Secondary | ICD-10-CM | POA: Diagnosis not present

## 2024-02-08 DIAGNOSIS — E860 Dehydration: Secondary | ICD-10-CM | POA: Diagnosis not present

## 2024-02-08 DIAGNOSIS — G4700 Insomnia, unspecified: Secondary | ICD-10-CM | POA: Diagnosis not present

## 2024-02-08 MED ORDER — TRAZODONE HCL 50 MG PO TABS: 25.0000 mg | ORAL_TABLET | Freq: Every evening | ORAL | 3 refills | Status: DC | PRN

## 2024-02-08 NOTE — Patient Instructions (Signed)
  VISIT SUMMARY: Today, you came in for your annual physical exam. We discussed your overall health, including your history of mild hyperlipidemia, occasional dizziness, and intermittent insomnia. We also talked about your recent activities and family life.  YOUR PLAN: -ADULT WELLNESS VISIT: This is your routine annual check-up to assess your overall health. 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 We will conduct several lab tests, including a fasting lipid panel, hemoglobin A1c, fasting metabolic panel, TSH, complete metabolic panel, urinalysis with micro plus reflex culture, and micro creatinine, microbium creatinine ratio. Vaccines were also administered today.  -HYPERLIPIDEMIA, CONTROLLED WITH LIFESTYLE MODIFICATION: Hyperlipidemia means you have higher levels of fats in your blood, which can increase your risk of heart disease. Your condition is currently controlled with lifestyle changes. We will recheck your lipid levels with a fasting lipid panel at a future visit.  -DEHYDRATION, MILD: Dehydration occurs when your body loses more fluids than it takes in. 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 Please increase your water intake, especially during hot and humid weather.  -INSOMNIA, INTERMITTENT: Insomnia is difficulty falling or staying asleep. You have been managing it with trazodone  as needed but experience fogginess the next day. Try taking a quarter dose of trazodone  to reduce these side effects. We can refill your prescription if needed.  INSTRUCTIONS: Please schedule a follow-up appointment for your fasting lab work. Continue to monitor your water intake and try the adjusted dose of trazodone  for your insomnia. If you have any concerns or experience any new symptoms, contact our office.

## 2024-02-08 NOTE — Progress Notes (Signed)
 Assessment  Assessment/Plan:  Assessment and Plan Assessment & Plan Adult Wellness Visit Annual physical examination with borderline low blood pressure, likely due to mild dehydration. Vitals otherwise stable. 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 - Order fasting lipid panel, hemoglobin A1c, fasting metabolic panel, TSH, complete metabolic panel, urinalysis with micro plus reflex culture, and micro creatinine, microbium creatinine ratio. - Administer vaccines.  Hyperlipidemia, controlled with lifestyle modification Mild hyperlipidemia controlled with lifestyle modifications. Lipid panel deferred due to non-fasting status. - Order fasting lipid panel for future visit.  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 Orthostatic vitals borderline, indicating possible dehydration. - Advise increased water intake, especially during hot and humid weather.  Insomnia, intermittent Intermittent insomnia managed with trazodone  as needed. Reports feeling foggy after trazodone  use. - Advise trying a quarter dose of trazodone  to reduce side effects. - Refill trazodone  prescription if needed.     Medications Discontinued During This Encounter  Medication Reason   traZODone  (DESYREL ) 50 MG tablet Reorder    Patient Counseling(The following topics were reviewed and/or handout was given):  -Nutrition: Stressed importance of moderation in sodium/caffeine intake, saturated fat and cholesterol, caloric balance, sufficient intake of fresh fruits, vegetables, and fiber.  -Stressed the importance of regular exercise.   -Substance Abuse: Discussed cessation/primary prevention of tobacco, alcohol, or other drug use; driving or other dangerous activities under the influence; availability of treatment for abuse.   -Injury prevention: Discussed safety belts, safety helmets, smoke detector, smoking near bedding or upholstery.    -Sexuality: Discussed sexually transmitted diseases, partner selection, use of condoms, avoidance of unintended pregnancy and contraceptive alternatives.   -Dental health: Discussed importance of regular tooth brushing, flossing, and dental visits.  -Health maintenance and immunizations reviewed. Please refer to Health maintenance section.  Return in about 1 year (around 02/07/2025) for physical (fasting labs).        Subjective:   Encounter date: 02/08/2024  Chief Complaint  Patient presents with   Annual Exam    Pt is not fasting today.   HM due- vaccinations    Dizziness    Pt voiced dizziness when standing     Discussed the use of AI scribe software for clinical note transcription with the patient, who gave verbal consent to proceed.  History of Present Illness Philip Park is a 28 year old male who presents for an annual physical exam.  He is here for his annual physical exam, during which his fasting lipid panel will be rechecked and he will be screened for potential cardiac risk factors, including diabetes. He has a history of mild hyperlipidemia, which is controlled with lifestyle modifications. He is not fasting today, so the lab work will be scheduled for a future date.  He experiences occasional dizziness. He has not been engaging in kickboxing recently but has been doing a lot of farming, specifically raising chickens, which involves significant physical activity outdoors. He notes that his orthostatic blood pressure readings have been borderline low, with lower numbers around 58 to 62.  He mentions a previous tick bite on his abdomen that occurred in May, which left a mild scar. No ongoing issues related to the tick bite.  He occasionally uses trazodone  as needed for sleep, taking half a pill approximately once a month. He notes that it makes him feel foggy the next day, so he uses it minimally. He also takes melatonin occasionally to aid sleep.  He has two  children, aged  eight and fifteen, and describes them as 'crazy'. His family life is busy, with his children recently starting school.       02/08/2024   10:51 AM 11/18/2023    1:54 PM 02/04/2023    8:28 AM 01/26/2022    1:37 PM 12/29/2021    1:42 PM  Depression screen PHQ 2/9  Decreased Interest 1 0 1 1 1   Down, Depressed, Hopeless 1 0 1 0 0  PHQ - 2 Score 2 0 2 1 1   Altered sleeping 2  2 1 1   Tired, decreased energy 1  1 1 1   Change in appetite 1  1 0 1  Feeling bad or failure about yourself  0  0 0 0  Trouble concentrating 1  1 1  0  Moving slowly or fidgety/restless 1  0 0 1  Suicidal thoughts 0  0 0 0  PHQ-9 Score 8  7 4 5   Difficult doing work/chores Somewhat difficult  Somewhat difficult Not difficult at all Somewhat difficult       02/08/2024   10:52 AM 02/04/2023    8:28 AM 01/26/2022    1:37 PM 12/29/2021    1:42 PM  GAD 7 : Generalized Anxiety Score  Nervous, Anxious, on Edge 1 1 2 2   Control/stop worrying 1 1 0 1  Worry too much - different things 1 1 1 1   Trouble relaxing 1 1 2 2   Restless 1 0 0 2  Easily annoyed or irritable 1 1 2 3   Afraid - awful might happen 0 0 0 0  Total GAD 7 Score 6 5 7 11   Anxiety Difficulty Somewhat difficult Not difficult at all Somewhat difficult Somewhat difficult    Health Maintenance Due  Topic Date Due   HPV VACCINES (2 - Male 3-dose series) 02/23/2022   Hepatitis B Vaccines (2 of 3 - 19+ 3-dose series) 03/10/2022       PMH:  The following were reviewed and entered/updated in epic: Past Medical History:  Diagnosis Date   Hematoma 12/29/2021   SCFE (slipped capital femoral epiphysis), left    SCFE (slipped capital femoral epiphysis), right    Sinus congestion 12/29/2021   Tick bite of abdominal wall 11/18/2023    Patient Active Problem List   Diagnosis Date Noted   Insomnia 02/04/2023   Chronic back pain 02/04/2023   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 1 g 11   traZODone  (DESYREL ) 50 MG tablet Take 0.5-1 tablets (25-50 mg total) by mouth at bedtime as needed for sleep. 30 tablet 3   No facility-administered medications prior to visit.    Allergies  Allergen Reactions   Penicillin G Rash   Penicillins Rash    Social History   Socioeconomic History   Marital status: Single    Spouse name: Not on file   Number of children: Not on file   Years of education: Not on file   Highest education level: Not on  file  Occupational History   Occupation: Pensions consultant  Tobacco Use   Smoking status: Former    Current packs/day: 0.50    Average packs/day: 0.5 packs/day for 1 year (0.5 ttl pk-yrs)    Types: Cigarettes    Passive exposure: Current   Smokeless tobacco: Never  Vaping Use   Vaping status: Every Day  Substance and Sexual Activity   Alcohol use: Yes    Alcohol/week: 20.0 standard drinks of alcohol    Types: 20 Shots of liquor per week   Drug use: Yes    Types: Marijuana   Sexual activity: Yes  Other Topics Concern   Not on file  Social History Narrative   Not on file   Social Drivers of Health   Financial Resource Strain: Not on file  Food Insecurity: Not on file  Transportation Needs: Not on file  Physical Activity: Not on file  Stress: Not on file  Social Connections: Not on file           Objective:  Physical Exam: BP (!) 112/55 (BP Location: Left Arm, Patient Position: Supine, Cuff Size: Large)   Pulse 60   Temp 97.9 F (36.6 C)  (Temporal)   Resp 18   Wt 209 lb 9.6 oz (95.1 kg)   SpO2 100%   BMI 26.55 kg/m   Body mass index is 26.55 kg/m. Wt Readings from Last 3 Encounters:  02/08/24 209 lb 9.6 oz (95.1 kg)  11/18/23 209 lb (94.8 kg)  02/04/23 205 lb 6.4 oz (93.2 kg)    Physical Exam GENERAL: Alert, cooperative, well developed, no acute distress HEENT: Normocephalic, normal oropharynx, moist mucous membranes CHEST: Clear to auscultation bilaterally, no wheezes, rhonchi, or crackles CARDIOVASCULAR: Normal heart rate and rhythm, S1 and S2 normal without murmurs ABDOMEN: Soft, non-tender, non-distended, without organomegaly, normal bowel sounds EXTREMITIES: No cyanosis or edema NEUROLOGICAL: Cranial nerves grossly intact, moves all extremities without gross motor or sensory deficit  Physical Exam      Orthostatic VS for the past 72 hrs (Last 3 readings):  Orthostatic BP Patient Position BP Location Cuff Size Orthostatic Pulse  02/08/24 1048 -- Supine Left Arm Large --  02/08/24 1046 102/64 Standing Left Arm Large 76  02/08/24 1041 110/58 Sitting Left Arm Large --  02/08/24 1039 -- Sitting Left Arm Large --    Prior labs:   No results found for this or any previous visit (from the past 2160 hours).  Lab Results  Component Value Date   CHOL 164 02/04/2023   CHOL 132 01/26/2022   Lab Results  Component Value Date   HDL 45.80 02/04/2023   HDL 44.10 01/26/2022   Lab Results  Component Value Date   LDLCALC 107 (H) 02/04/2023   LDLCALC 78 01/26/2022   Lab Results  Component Value Date   TRIG 59.0 02/04/2023   TRIG 46.0 01/26/2022   Lab Results  Component Value Date   CHOLHDL 4 02/04/2023   CHOLHDL 3 01/26/2022   No results found for: LDLDIRECT  Last metabolic panel Lab Results  Component Value Date   GLUCOSE 90 02/04/2023   NA 139 02/04/2023   K 4.1 02/04/2023   CL 104 02/04/2023   CO2 29 02/04/2023   BUN 14 02/04/2023   CREATININE 0.91 02/04/2023   GFR 115.16 02/04/2023    CALCIUM 9.2 02/04/2023   PROT 7.4 01/26/2022   ALBUMIN 4.4 01/26/2022   BILITOT 0.6 01/26/2022   ALKPHOS 50 01/26/2022   AST 21 01/26/2022   ALT  25 01/26/2022    No results found for: HGBA1C  Last CBC No results found for: WBC, HGB, HCT, MCV, MCH, RDW, PLT  No results found for: TSH  No results found for: PSA1, PSA  Last vitamin D No results found for: MARIEN BOLLS, VD25OH  Lab Results  Component Value Date   COLORU yellow 07/09/2022   CLARITYU clwear 07/09/2022   GLUCOSEUR Negative 07/09/2022   BILIRUBINUR neg 07/09/2022   KETONESU neg 07/09/2022   SPECGRAV 1.010 07/09/2022   RBCUR pos 07/09/2022   PHUR 5.5 07/09/2022   PROTEINUR Negative 07/09/2022   UROBILINOGEN 0.2 (A) 07/09/2022   LEUKOCYTESUR Negative 07/09/2022    No results found for: LABMICR, MICROALBUR   At today's visit, we discussed treatment options, associated risk and benefits, and engage in counseling as needed.  Additionally the following were reviewed: Past medical records, past medical and surgical history, family and social background, as well as relevant laboratory results, imaging findings, and specialty notes, where applicable.  This message was generated using dictation software, and as a result, it may contain unintentional typos or errors.  Nevertheless, extensive effort was made to accurately convey at the pertinent aspects of the patient visit.    There may have been are other unrelated non-urgent complaints, but due to the busy schedule and the amount of time already spent with him, time does not permit to address these issues at today's visit. Another appointment may have or has been requested to review these additional issues.     Arvella Hummer, MD, MS

## 2024-02-10 ENCOUNTER — Other Ambulatory Visit (INDEPENDENT_AMBULATORY_CARE_PROVIDER_SITE_OTHER)

## 2024-02-10 DIAGNOSIS — E782 Mixed hyperlipidemia: Secondary | ICD-10-CM | POA: Diagnosis not present

## 2024-02-10 LAB — COMPREHENSIVE METABOLIC PANEL WITH GFR
ALT: 25 U/L (ref 0–53)
AST: 22 U/L (ref 0–37)
Albumin: 4.2 g/dL (ref 3.5–5.2)
Alkaline Phosphatase: 41 U/L (ref 39–117)
BUN: 17 mg/dL (ref 6–23)
CO2: 32 meq/L (ref 19–32)
Calcium: 9 mg/dL (ref 8.4–10.5)
Chloride: 101 meq/L (ref 96–112)
Creatinine, Ser: 0.89 mg/dL (ref 0.40–1.50)
GFR: 116.26 mL/min (ref >60.00)
Glucose, Bld: 89 mg/dL (ref 70–99)
Potassium: 4 meq/L (ref 3.5–5.1)
Sodium: 137 meq/L (ref 135–145)
Total Bilirubin: 0.5 mg/dL (ref 0.2–1.2)
Total Protein: 7 g/dL (ref 6.0–8.3)

## 2024-02-10 LAB — MICROALBUMIN / CREATININE URINE RATIO
Creatinine,U: 61.7 mg/dL
Microalb Creat Ratio: UNDETERMINED mg/g (ref 0.0–30.0)
Microalb, Ur: <0.7 mg/dL

## 2024-02-10 LAB — CBC WITH DIFFERENTIAL/PLATELET
Basophils Absolute: 0 K/uL (ref 0.0–0.1)
Basophils Relative: 0.8 % (ref 0.0–3.0)
Eosinophils Absolute: 0.2 K/uL (ref 0.0–0.7)
Eosinophils Relative: 5.1 % — ABNORMAL HIGH (ref 0.0–5.0)
HCT: 42.1 % (ref 39.0–52.0)
Hemoglobin: 13.9 g/dL (ref 13.0–17.0)
Lymphocytes Relative: 28.1 % (ref 12.0–46.0)
Lymphs Abs: 1.2 K/uL (ref 0.7–4.0)
MCHC: 33.1 g/dL (ref 30.0–36.0)
MCV: 97.3 fl (ref 78.0–100.0)
Monocytes Absolute: 0.5 K/uL (ref 0.1–1.0)
Monocytes Relative: 11 % (ref 3.0–12.0)
Neutro Abs: 2.4 K/uL (ref 1.4–7.7)
Neutrophils Relative %: 55 % (ref 43.0–77.0)
Platelets: 149 K/uL — ABNORMAL LOW (ref 150.0–400.0)
RBC: 4.32 Mil/uL (ref 4.22–5.81)
RDW: 14 % (ref 11.5–15.5)
WBC: 4.4 K/uL (ref 4.0–10.5)

## 2024-02-10 LAB — LIPID PANEL
Cholesterol: 151 mg/dL (ref 0–200)
HDL: 47.8 mg/dL (ref >39.00)
LDL Cholesterol: 86 mg/dL (ref 0–99)
NonHDL: 102.76
Total CHOL/HDL Ratio: 3
Triglycerides: 83 mg/dL (ref 0.0–149.0)
VLDL: 16.6 mg/dL (ref 0.0–40.0)

## 2024-02-10 LAB — HEMOGLOBIN A1C: Hgb A1c MFr Bld: 6.2 % (ref 4.6–6.5)

## 2024-02-10 LAB — TSH: TSH: 0.91 u[IU]/mL (ref 0.35–5.50)

## 2024-02-11 LAB — URINALYSIS W MICROSCOPIC + REFLEX CULTURE
Bacteria, UA: NONE SEEN /HPF
Bilirubin Urine: NEGATIVE
Glucose, UA: NEGATIVE
Hgb urine dipstick: NEGATIVE
Hyaline Cast: NONE SEEN /LPF
Ketones, ur: NEGATIVE
Leukocyte Esterase: NEGATIVE
Nitrites, Initial: NEGATIVE
Protein, ur: NEGATIVE
RBC / HPF: NONE SEEN /HPF (ref 0–2)
Specific Gravity, Urine: 1.01 (ref 1.001–1.035)
Squamous Epithelial / HPF: NONE SEEN /HPF (ref <=5)
WBC, UA: NONE SEEN /HPF (ref 0–5)
pH: 6.5 (ref 5.0–8.0)

## 2024-02-11 LAB — NO CULTURE INDICATED

## 2024-02-15 ENCOUNTER — Ambulatory Visit: Payer: Self-pay | Admitting: Family Medicine

## 2024-05-30 ENCOUNTER — Other Ambulatory Visit: Payer: Self-pay | Admitting: Family Medicine

## 2024-05-30 DIAGNOSIS — G4700 Insomnia, unspecified: Secondary | ICD-10-CM

## 2024-05-30 NOTE — Telephone Encounter (Unsigned)
 Copied from CRM #[id number]. Topic: Clinical - Medication Refill >> May 30, 2024 12:22 PM Aisha D wrote: Medication: traZODone  (DESYREL ) 50 MG tablet  Has the patient contacted their pharmacy? Yes (Agent: If no, request that the patient contact the pharmacy for the refill. If patient does not wish to contact the pharmacy document the reason why and proceed with request.) (Agent: If yes, when and what did the pharmacy advise?)  This is the patient's preferred pharmacy:  [id number] - [place], [place] - [address], [address] AT [facility] [address], [address] [place] [address] Phone: [phone] Fax: [phone]  Is this the correct pharmacy for this prescription? Yes If no, delete pharmacy and type the correct one.   Has the prescription been filled recently? No  Is the patient out of the medication? Yes  Has the patient been seen for an appointment in the last year OR does the patient have an upcoming appointment? Yes  Can we respond through MyChart? Yes  Agent: Please be advised that Rx refills may take up to 3 business days. We ask that you follow-up with your pharmacy.

## 2024-06-01 MED ORDER — TRAZODONE HCL 50 MG PO TABS: 25.0000 mg | ORAL_TABLET | Freq: Every evening | ORAL | 3 refills | Status: AC | PRN

## 2025-02-13 ENCOUNTER — Encounter: Admitting: Family Medicine
